# Patient Record
Sex: Male | Born: 1975 | Race: Black or African American | Hispanic: No
Health system: Southern US, Community
[De-identification: ages and names within clinical notes are randomized; demographics above are authoritative.]

## PROBLEM LIST (undated history)

## (undated) HISTORY — PX: BACK SURGERY: SHX140

---

## 1999-11-17 ENCOUNTER — Emergency Department (HOSPITAL_COMMUNITY): Admission: EM | Admit: 1999-11-17 | Discharge: 1999-11-17 | Payer: Self-pay | Admitting: Emergency Medicine

## 2002-02-06 ENCOUNTER — Emergency Department (HOSPITAL_COMMUNITY): Admission: EM | Admit: 2002-02-06 | Discharge: 2002-02-06 | Payer: Self-pay | Admitting: Emergency Medicine

## 2005-07-08 ENCOUNTER — Emergency Department (HOSPITAL_COMMUNITY): Admission: EM | Admit: 2005-07-08 | Discharge: 2005-07-09 | Payer: Self-pay | Admitting: Emergency Medicine

## 2005-09-01 ENCOUNTER — Emergency Department (HOSPITAL_COMMUNITY): Admission: EM | Admit: 2005-09-01 | Discharge: 2005-09-01 | Payer: Self-pay | Admitting: Emergency Medicine

## 2006-06-04 ENCOUNTER — Emergency Department (HOSPITAL_COMMUNITY): Admission: EM | Admit: 2006-06-04 | Discharge: 2006-06-04 | Payer: Self-pay | Admitting: Emergency Medicine

## 2008-11-15 ENCOUNTER — Emergency Department (HOSPITAL_COMMUNITY): Admission: EM | Admit: 2008-11-15 | Discharge: 2008-11-16 | Payer: Self-pay | Admitting: *Deleted

## 2009-06-03 ENCOUNTER — Emergency Department (HOSPITAL_COMMUNITY): Admission: EM | Admit: 2009-06-03 | Discharge: 2009-06-03 | Payer: Self-pay | Admitting: Emergency Medicine

## 2010-12-17 ENCOUNTER — Emergency Department (HOSPITAL_COMMUNITY): Payer: No Typology Code available for payment source

## 2010-12-17 ENCOUNTER — Emergency Department (HOSPITAL_COMMUNITY)
Admission: EM | Admit: 2010-12-17 | Discharge: 2010-12-17 | Disposition: A | Discharge status: Home / Self Care | Payer: No Typology Code available for payment source | Attending: Emergency Medicine | Admitting: Emergency Medicine

## 2010-12-17 DIAGNOSIS — M549 Dorsalgia, unspecified: Secondary | ICD-10-CM | POA: Insufficient documentation

## 2010-12-17 DIAGNOSIS — M4120 Other idiopathic scoliosis, site unspecified: Secondary | ICD-10-CM | POA: Insufficient documentation

## 2011-01-08 ENCOUNTER — Ambulatory Visit: Payer: No Typology Code available for payment source | Admitting: Radiation Oncology

## 2011-07-26 ENCOUNTER — Emergency Department (HOSPITAL_COMMUNITY)
Admission: EM | Admit: 2011-07-26 | Discharge: 2011-07-26 | Disposition: A | Discharge status: Home / Self Care | Payer: PRIVATE HEALTH INSURANCE | Attending: Emergency Medicine | Admitting: Emergency Medicine

## 2011-07-26 ENCOUNTER — Emergency Department (HOSPITAL_COMMUNITY): Payer: PRIVATE HEALTH INSURANCE

## 2011-07-26 DIAGNOSIS — W268XXA Contact with other sharp object(s), not elsewhere classified, initial encounter: Secondary | ICD-10-CM | POA: Insufficient documentation

## 2011-07-26 DIAGNOSIS — S61409A Unspecified open wound of unspecified hand, initial encounter: Secondary | ICD-10-CM | POA: Insufficient documentation

## 2011-07-26 DIAGNOSIS — S61412A Laceration without foreign body of left hand, initial encounter: Secondary | ICD-10-CM

## 2011-07-26 DIAGNOSIS — S61011A Laceration without foreign body of right thumb without damage to nail, initial encounter: Secondary | ICD-10-CM

## 2011-07-26 DIAGNOSIS — S61209A Unspecified open wound of unspecified finger without damage to nail, initial encounter: Secondary | ICD-10-CM | POA: Insufficient documentation

## 2011-07-26 MED ORDER — TETANUS-DIPHTH-ACELL PERTUSSIS 5-2.5-18.5 LF-MCG/0.5 IM SUSP
0.5000 mL | Freq: Once | INTRAMUSCULAR | Status: AC
  Administered 2011-07-26: 0.5 mL via INTRAMUSCULAR
  Filled 2011-07-26: qty 0.5

## 2011-07-26 MED ORDER — HYDROCODONE-ACETAMINOPHEN 5-325 MG PO TABS: 1.0000 | ORAL_TABLET | ORAL | Status: AC | PRN

## 2011-07-26 MED ORDER — CEPHALEXIN 500 MG PO CAPS: 500.0000 mg | ORAL_CAPSULE | Freq: Four times a day (QID) | ORAL | Status: DC

## 2011-07-26 MED ORDER — LIDOCAINE HCL 1 % IJ SOLN
INTRAMUSCULAR | Status: AC
  Administered 2011-07-26: 20:00:00
  Filled 2011-07-26: qty 20

## 2011-07-26 MED ORDER — CEPHALEXIN 500 MG PO CAPS: 500.0000 mg | ORAL_CAPSULE | Freq: Four times a day (QID) | ORAL | Status: AC

## 2011-07-26 MED ORDER — LIDOCAINE-EPINEPHRINE (PF) 1 %-1:200000 IJ SOLN
INTRAMUSCULAR | Status: AC
  Administered 2011-07-26: 20:00:00
  Filled 2011-07-26: qty 10
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
## 2011-07-26 NOTE — ED Notes (Signed)
Patient transported to X-ray 

## 2011-07-26 NOTE — ED Notes (Signed)
MD at bedside. Sutures in progress.
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
## 2011-07-27 NOTE — ED Provider Notes (Signed)
Medical screening examination/treatment/procedure(s) were conducted as a shared visit with non-physician practitioner(s) and myself.  I personally evaluated the patient during the encounter  Personally evaluated the patient's left hand injury.  There is a complex laceration of his left hand without tendon involvement.  There does not appear to be any arterial bleeding.  Normal perfusion of his distal fingers.  Has normal sensation as well and motor function.  The laceration was complex and I placed four deep sutures with 4-0 Vicryl to close the majority wound.  My physician assistant close the remaining portions of the wound.  Examined discharge and was hemostatic with ongoing normal perfusion distally.  Infection warnings were given.  Patient will return to the ER or follow up with his primary care physician for suture removal  Dg Hand Complete Left  07/26/2011  *RADIOLOGY REPORT*  Clinical Data: Hand laceration.  LEFT HAND - COMPLETE 3+ VIEW  Comparison: None  Findings: Soft tissue laceration within the hypothenar eminence. No radiopaque foreign bodies.  No underlying bony abnormality.  No fracture, subluxation or dislocation.  IMPRESSION: No acute bony abnormality.  Original Report Authenticated By: Cyndie Chime, M.D.   Dg Finger Thumb Right  07/26/2011  *RADIOLOGY REPORT*  Clinical Data: Laceration.  RIGHT THUMB 2+V  Comparison: None.  Findings: No acute bony abnormality.  Specifically, no fracture, subluxation, or dislocation.  Soft tissues are intact.  No radiopaque foreign body.  IMPRESSION: No acute bony abnormality.  Original Report Authenticated By: Cyndie Chime, M.D.   I personally evaluated the patient's x-ray  1. Laceration of left hand   2. Laceration of right thumb      Lyanne Co, MD 07/27/11 [phone]

## 2011-09-07 ENCOUNTER — Encounter (HOSPITAL_COMMUNITY): Payer: Self-pay

## 2011-09-07 ENCOUNTER — Emergency Department (HOSPITAL_COMMUNITY)
Admission: EM | Admit: 2011-09-07 | Discharge: 2011-09-07 | Disposition: A | Discharge status: Home / Self Care | Payer: PRIVATE HEALTH INSURANCE | Attending: Emergency Medicine | Admitting: Emergency Medicine

## 2011-09-07 DIAGNOSIS — N509 Disorder of male genital organs, unspecified: Secondary | ICD-10-CM | POA: Insufficient documentation

## 2011-09-07 DIAGNOSIS — N498 Inflammatory disorders of other specified male genital organs: Secondary | ICD-10-CM | POA: Insufficient documentation

## 2011-09-07 DIAGNOSIS — N492 Inflammatory disorders of scrotum: Secondary | ICD-10-CM

## 2011-09-07 DIAGNOSIS — N5089 Other specified disorders of the male genital organs: Secondary | ICD-10-CM | POA: Insufficient documentation

## 2011-09-07 MED ORDER — OXYCODONE-ACETAMINOPHEN 5-325 MG PO TABS: 1.0000 | ORAL_TABLET | Freq: Four times a day (QID) | ORAL | Status: AC | PRN

## 2011-09-07 MED ORDER — SULFAMETHOXAZOLE-TRIMETHOPRIM 800-160 MG PO TABS: 1.0000 | ORAL_TABLET | Freq: Two times a day (BID) | ORAL | Status: AC

## 2011-09-07 NOTE — ED Provider Notes (Signed)
Medical screening examination/treatment/procedure(s) were conducted as a shared visit with non-physician practitioner(s) and myself.  I personally evaluated the patient during the encounter.  He has a right scrotum mass that worsened after he squeezed it. It's been present previously and he had it opened due to infection. Has not had a fever, nausea, vomiting, or difficulty urinating. Right scrotum has a 3 x 3 x 2 cm deep red and fluctuant area. Penis is normal. Testicles are palpable in the scrotum and are normal. We'll contact urology for assistance in treatment and disposition.  Flint Melter, MD 09/07/11 [phone]

## 2011-09-07 NOTE — ED Notes (Signed)
The pt says he has a rt groin abcess for 2 days.  He has had previous ones.

## 2011-09-07 NOTE — ED Provider Notes (Signed)
History     CSN: [id number]  Arrival date & time 09/07/11  0100   First MD Initiated Contact with Patient 09/07/11 [phone]      Chief Complaint  Patient presents with  . Abscess    right inner thigh/groin area    (Consider location/radiation/quality/duration/timing/severity/associated sxs/prior treatment) HPI Patient presents emergency Dept. with an abscess to his right scrotum.  He states that the area developed over the last 2 days.  He states the areas gotten more swollen since yesterday.  He denies any testicular pain or swelling around  the rest of the scrotum.  Patient denies fevers, nausea/vomiting, abdominal pain, perineal pain, or dysuria. History reviewed. No pertinent past medical history.  Past Surgical History  Procedure Date  . Back surgery     No family history on file.  History  Substance Use Topics  . Smoking status: Never Smoker   . Smokeless tobacco: Not on file  . Alcohol Use: No      Review of Systems All pertinent positives and negatives reviewed in the history of present illness  Allergies  Review of patient's allergies indicates no known allergies.  Home Medications   Current Outpatient Rx  Name Route Sig Dispense Refill  . HYDROCODONE-ACETAMINOPHEN 5-325 MG PO TABS Oral Take 1 tablet by mouth every 6 (six) hours as needed. For pain    . SULFAMETHOXAZOLE-TRIMETHOPRIM 800-160 MG PO TABS Oral Take 1 tablet by mouth 2 (two) times daily. Take until all medication used. 42 tablet 0    BP 133/86  Pulse 88  Temp(Src) 98 F (36.7 C) (Oral)  Resp 18  SpO2 100%  Physical Exam  Constitutional: He appears well-developed and well-nourished. No distress.  HENT:  Head: Normocephalic and atraumatic.  Cardiovascular: Normal rate and regular rhythm.   Pulmonary/Chest: Effort normal and breath sounds normal.  Skin: Skin is warm and dry.       Patient has an abscess to the right scrotum.  The area measures 3 x 4 cm with about 2 cm at depth to it.     ED Course  Procedures (including critical care time)  Labs Reviewed  GLUCOSE, CAPILLARY - Abnormal; Notable for the following:    Glucose-Capillary 107 (*)    All other components within normal limits   I spoke with Dr. Berneice Heinrich.  He is to come in and see the patient for I&D of this abscessed area of his right scrotum.  He will set up followup with patient in his office.       MDM  See above        Carlyle Dolly, PA-C 09/07/11 0300  Carlyle Dolly, PA-C 09/07/11 [phone]

## 2011-09-07 NOTE — Op Note (Signed)
NAMEMALIK, PAAR             ACCOUNT NO.:  [id number]  MEDICAL RECORD NO.:  [id number]  LOCATION:  CDU11                        FACILITY:  [facility]  PHYSICIAN:  Sebastian Ache, MD     DATE OF BIRTH:  [birth year]  DATE OF PROCEDURE: DATE OF DISCHARGE:  09/07/2011                              OPERATIVE REPORT   DIAGNOSIS:  Right scrotal abscess.  PROCEDURE:  Incision and drainage of right scrotal abscess at bedside.  FINDINGS: 1. A 4-cm right scrotal abscess separate from cord, testicles. 2. No evidence of fasciitis or systemic infection.  PREASSESSMENT:  None.  DRAINS:  Wound is packed, no drains.  COMPLICATIONS:  None.  ESTIMATED BLOOD LOSS:  Nil.  INDICATIONS:  Daniel Frederick is a 36 year old very pleasant gentleman with history of prior minor soft tissue infections who presents with 2 days of acutely worsened right scrotal swelling and pain.  Exam is consistent with scrotal wall abscess so that the burden of material is not amenable to oral antibiotics alone and that incision and drainage be warranted. Also discussed proceeding at the bedside versus the OR and the patient is amenable to a bedside drainage.  A verbal consent was obtained.  PROCEDURE IN DETAIL:  The patient being Daniel Frederick, verified; procedure being incision and drainage confirmed.  The procedure was carried out.  A sterile field was created using disposable sterile towels and iodine solution in the right hemiscrotum.  Infiltration with 10 mL 2% lidocaine was performed.  A 3-cm incision was then made directly into the abscess which was then evacuated of all purulent material.  Digital palpation of the wound revealed no tracking towards the groin or abdominal wall, incomplete evacuation of all palpable material.  This was then packed using iodoform gauze.  Dry dressing applied.  The patient tolerated the procedure well and will be discharged from the ER with oral antibiotics.     ______________________________ Sebastian Ache, MD     TM/MEDQ  D:  09/07/2011  T:  09/07/2011  Job:  [id number]

## 2011-09-07 NOTE — ED Notes (Signed)
Patient is AOx4 and comfortable with his discharge instructions. 

## 2011-09-07 NOTE — Brief Op Note (Signed)
  2:53 AM  PATIENT:  Daniel Frederick  36 y.o. male  PRE-OPERATIVE DIAGNOSIS: Scrotal Wall Abscess  PROCEDURE:  I+D Scrotum (bedside)  SURGEON:  T. Tyreona Panjwani  ANESTHESIA:   10cc lidocaine local infiltration  EBL: nil      DRAINS: wound packing only  DICTATION: .Other Dictation: Dictation Number [id number]  PLAN OF CARE: Discharge from ER

## 2011-09-07 NOTE — ED Provider Notes (Signed)
Medical screening examination/treatment/procedure(s) were conducted as a shared visit with non-physician practitioner(s) and myself.  I personally evaluated the patient during the encounter  Flint Melter, MD 09/07/11 [phone]

## 2011-09-07 NOTE — Consult Note (Signed)
Reason for Consult:Scrotal Wall Abscess Referring Physician: ER Physician  Daniel Frederick is an 36 y.o. male.  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 Notes 2 mo prodrome of small right scrotal boil that has come and gone but now with increased pain and swelling. No drainage. No fevers, No malaise. No change in voiding or bowel habits.   Pain is 4/10, constant, located in right scrotum, better with lying still.  PMH sig for scoliosis s/p surgery in late teens, no other surgeries. No prior GU eval. NO h/o HIV, immunosupression or diabetes.  History reviewed. No pertinent past medical history.  Past Surgical History  Procedure Date  . Back surgery     No family history on file.  Social History:  reports that he has never smoked. He does not have any smokeless tobacco history on file. He reports that he uses illicit drugs (Marijuana). He reports that he does not drink alcohol.  Allergies: No Known Allergies  Medications: I have reviewed the patient's current medications.  No results found for this or any previous visit (from the past 48 hour(s)).  No results found.  Review of Systems  Constitutional: Negative for fever, chills and malaise/fatigue.  HENT: Negative.   Eyes: Negative.   Respiratory: Negative.   Cardiovascular: Negative.   Gastrointestinal: Positive for nausea. Negative for vomiting.  Genitourinary: Negative.  Negative for dysuria, frequency, hematuria and flank pain.       Progressive scrotal pain / swelling  Musculoskeletal: Negative.   Skin: Negative.   Endo/Heme/Allergies: Negative.   Psychiatric/Behavioral: Negative.    Blood pressure 133/86, pulse 88, temperature 98 F (36.7 C), temperature source Oral, resp. rate 18, SpO2 100.00%. Physical Exam    Constitutional: He is oriented to person, place, and time. He appears well-developed and well-nourished.       In [facility] CDU bed 11 in ER  HENT:  Head: Normocephalic and atraumatic.  Eyes: Pupils are equal, round, and reactive to light.  Neck: Normal range of motion. Neck supple.  Cardiovascular: Normal rate and regular rhythm.   Respiratory: Effort normal and breath sounds normal.  GI: Soft. Bowel sounds are normal. He exhibits no mass. There is no tenderness. There is no rebound and no guarding.  Genitourinary: Penis normal.       4cm rt scrotal wall abscess. No tracking to groin. No crepitus. Separate from cord / testicle. No tracking to abdomen.  Musculoskeletal: Normal range of motion.  Neurological: He is alert and oriented to person, place, and time.  Skin: Skin is warm and dry.  Psychiatric: He has a normal mood and affect. His behavior is normal. Judgment and thought content normal.    Assessment/Plan: 1 - Scrotal Abscess - I+D performed at bedside as per separate dictated note. NO s/s fascitits or systemic infection. Pt to pack wound and come to [facility] clinic for re-pack in few days. RX'd 3 weeks bactrim for skin and empiric MRSA coverage. Warned to return to ED for worsening infection, high fevers, or swelling / tracking to groin or abdomen.  Daniel Frederick 09/07/2011, 2:43 AM

## 2012-06-03 IMAGING — CR DG FINGER THUMB 2+V*R*
3 series · 3 of 3 positions shown · non-contrast
Comparison: None.

CLINICAL DATA: Laceration.

RIGHT THUMB 2+V

[x finger pa right (1 of 2)]
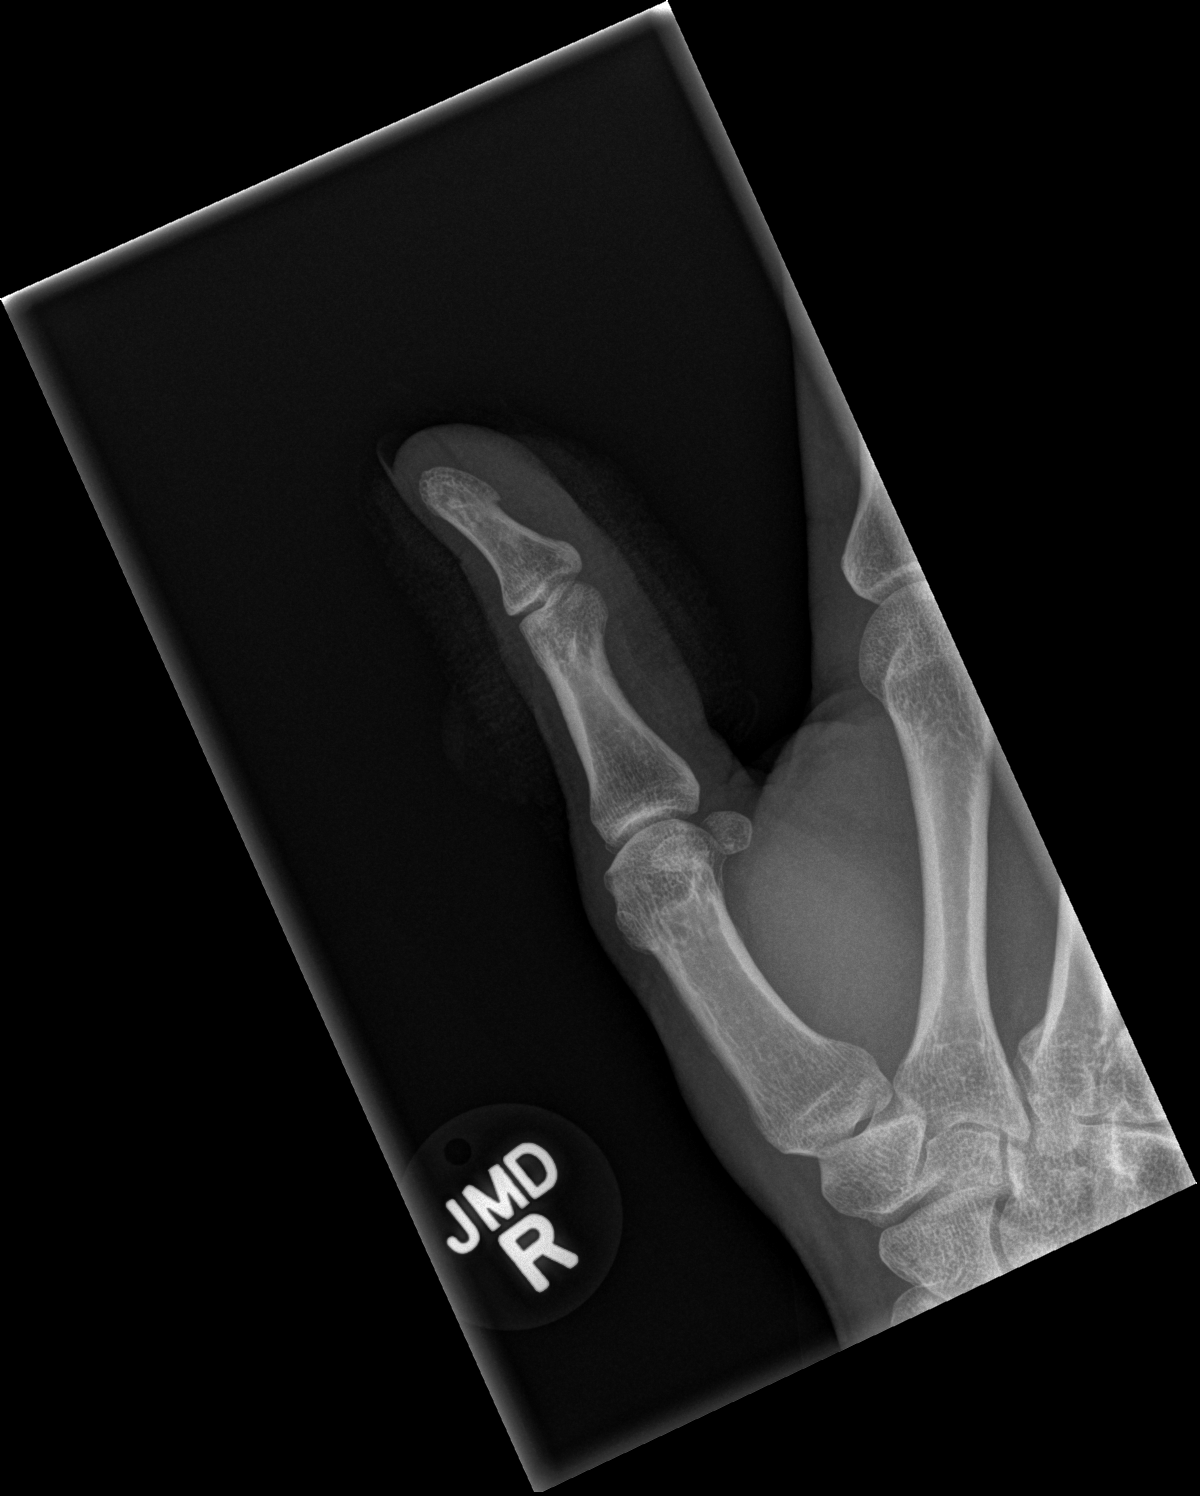

[x finger pa right (2 of 2)]
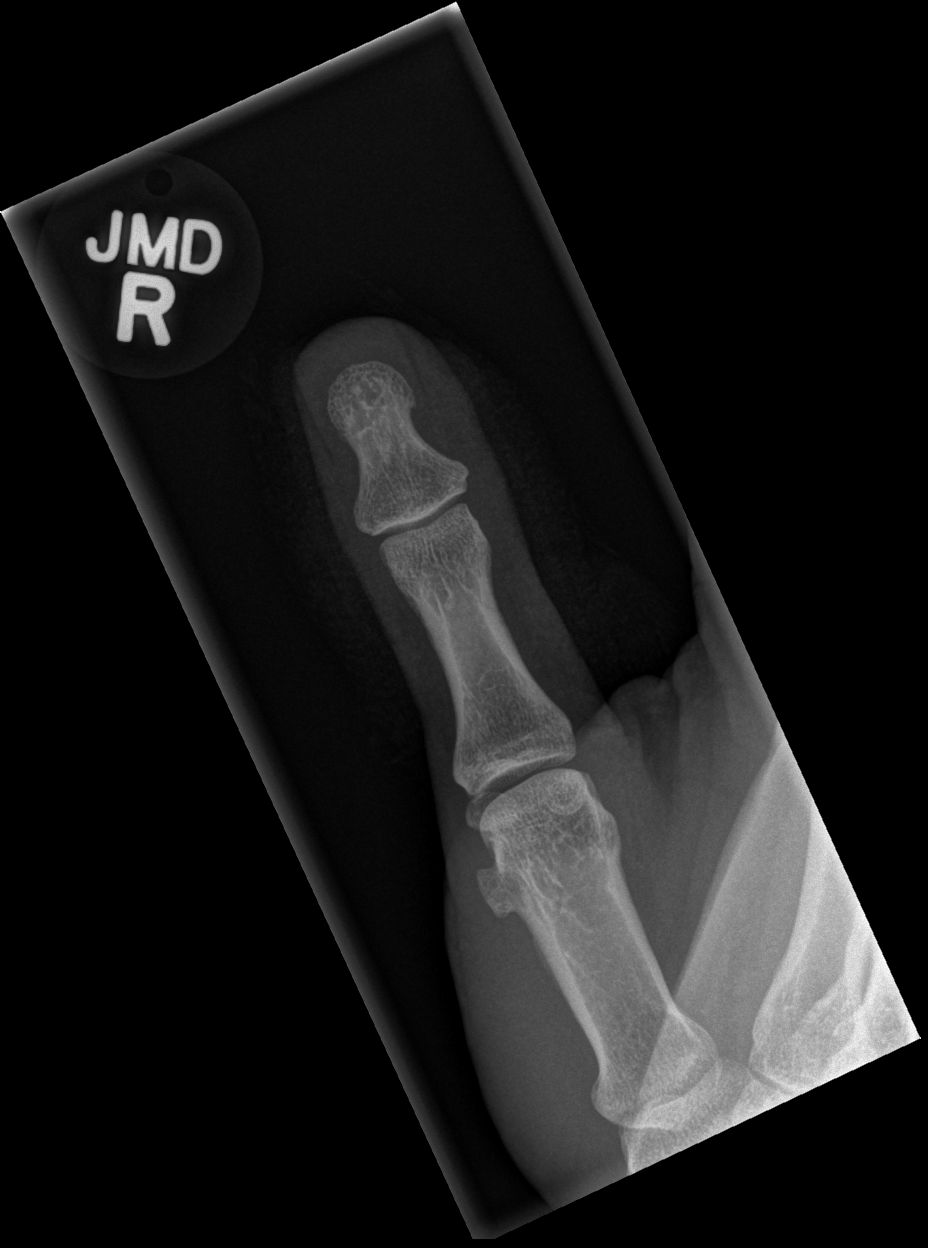

[x finger lat right]
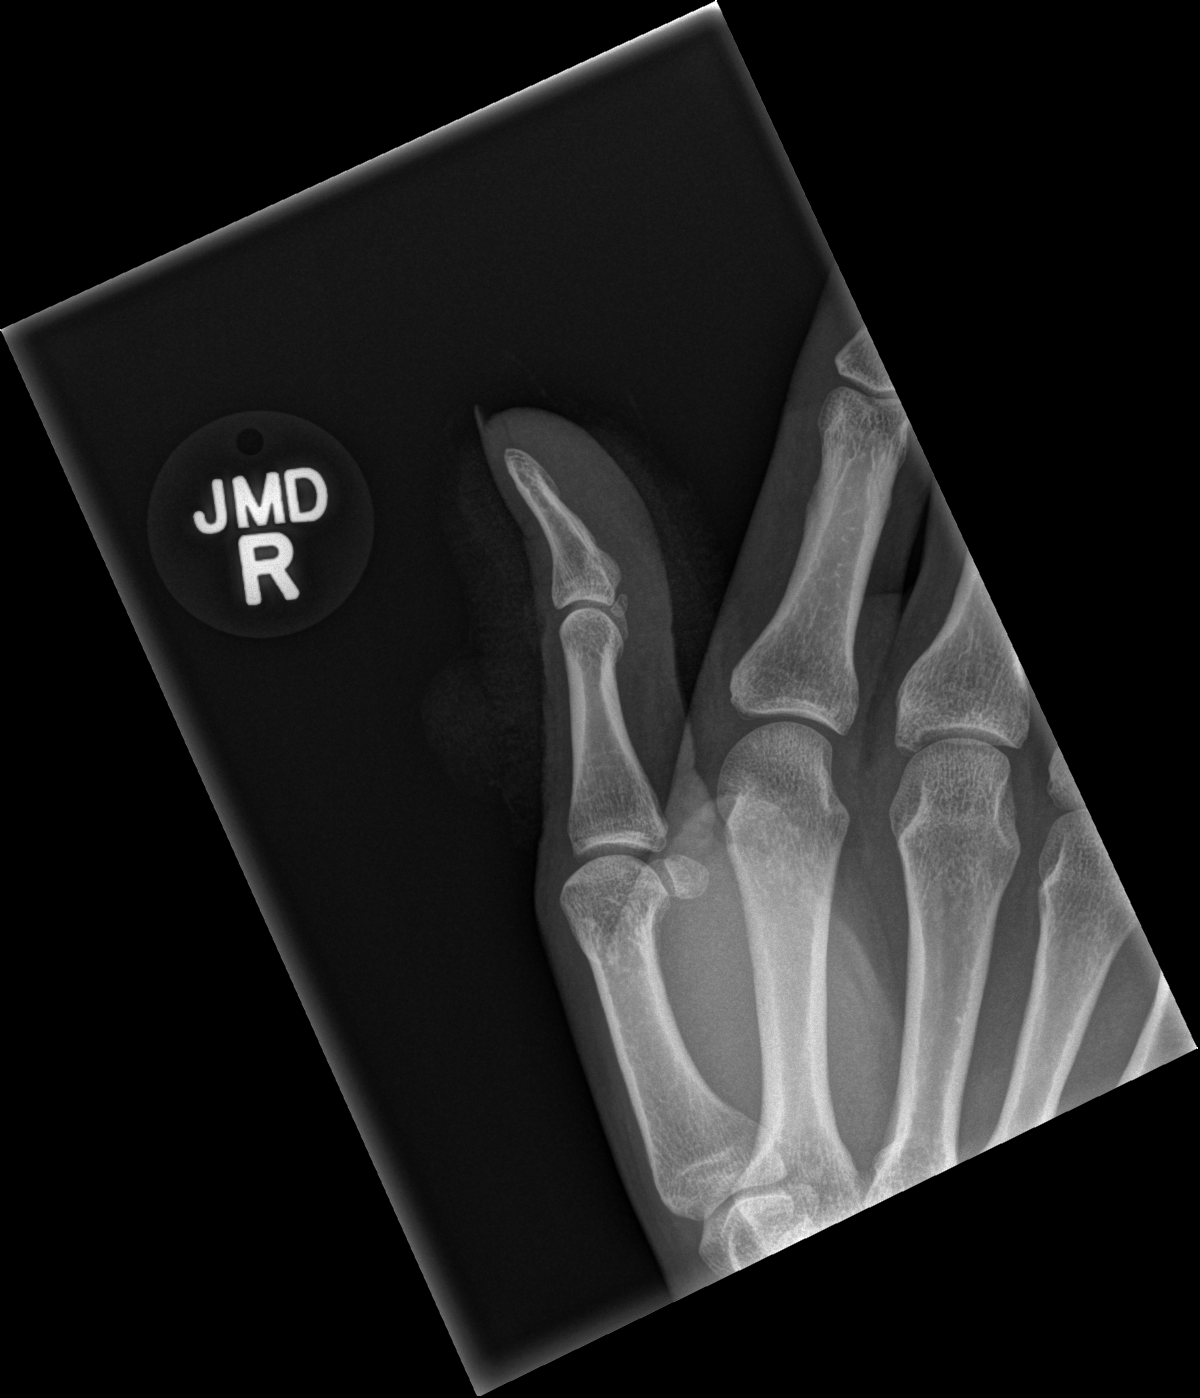

[3 of 3 positions shown; findings below may reference images not displayed]

FINDINGS: No acute bony abnormality.  Specifically, no fracture,
subluxation, or dislocation.  Soft tissues are intact.  No
radiopaque foreign body.
IMPRESSION: No acute bony abnormality.

## 2012-06-03 IMAGING — CR DG HAND COMPLETE 3+V*L*
3 series · 3 of 3 positions shown · non-contrast
Comparison: None

CLINICAL DATA: Hand laceration.

LEFT HAND - COMPLETE 3+ VIEW

[x hand pa left (1 of 2)]
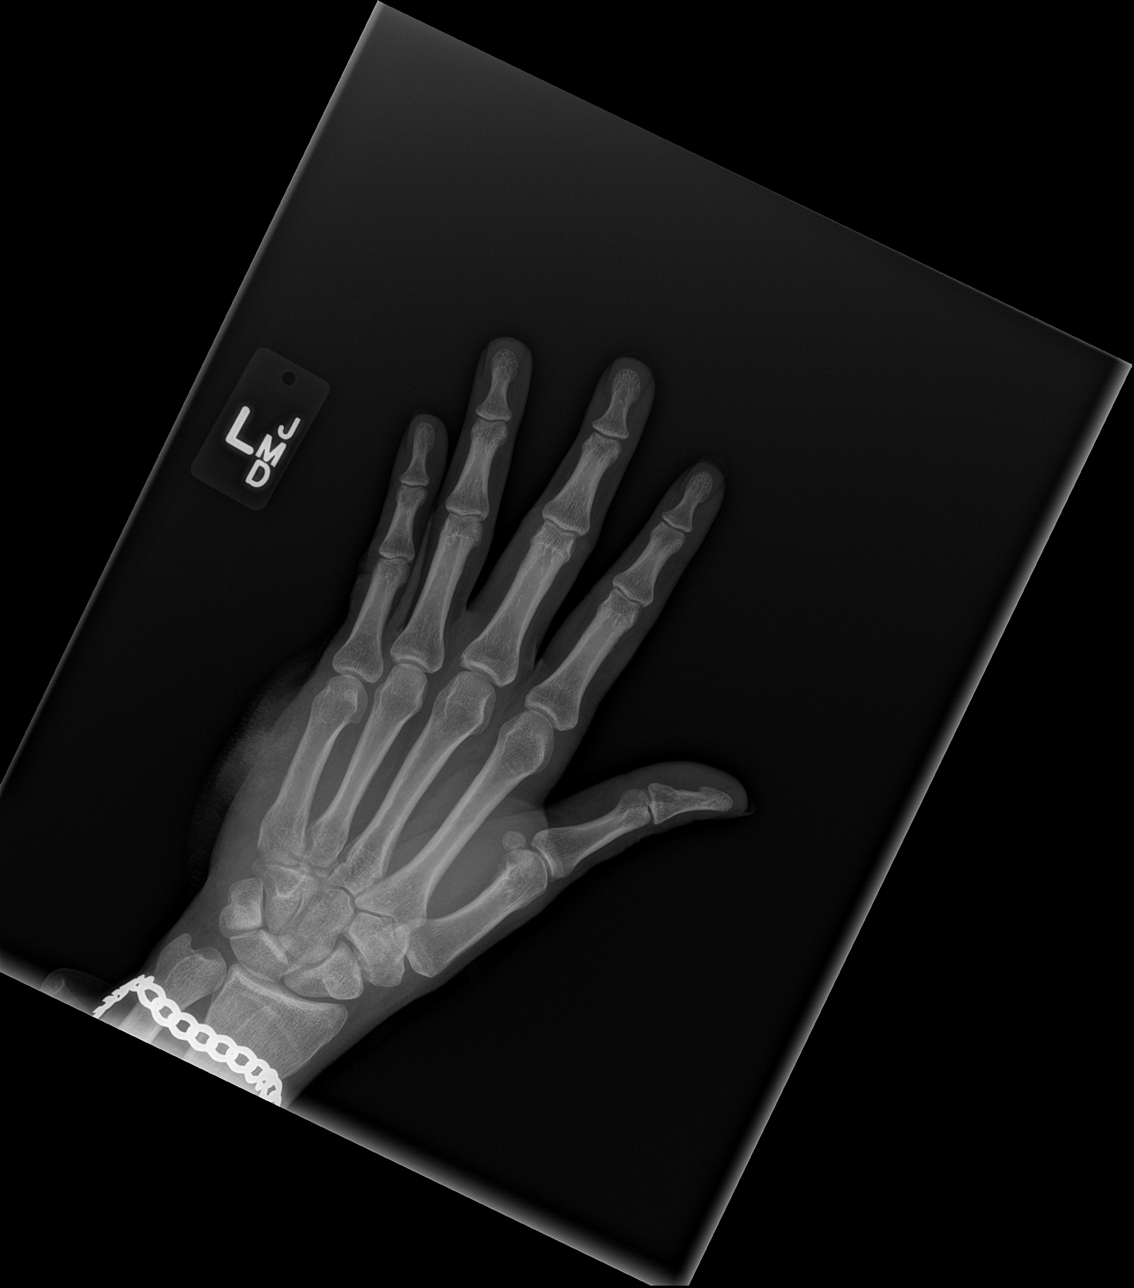

[x hand pa left (2 of 2)]
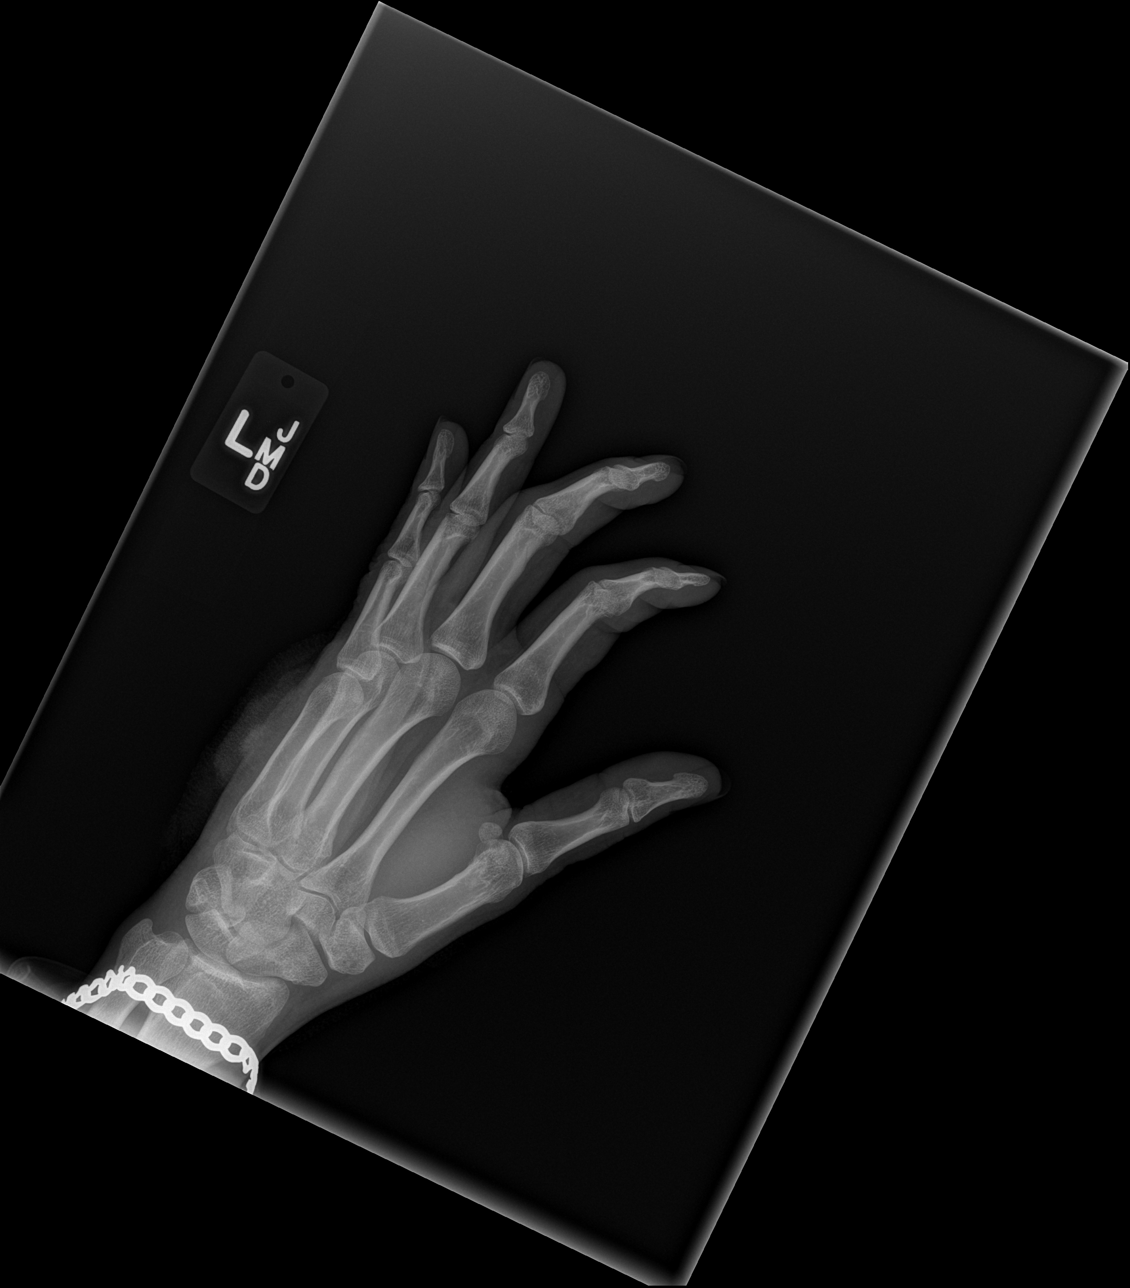

[x hand lat left]
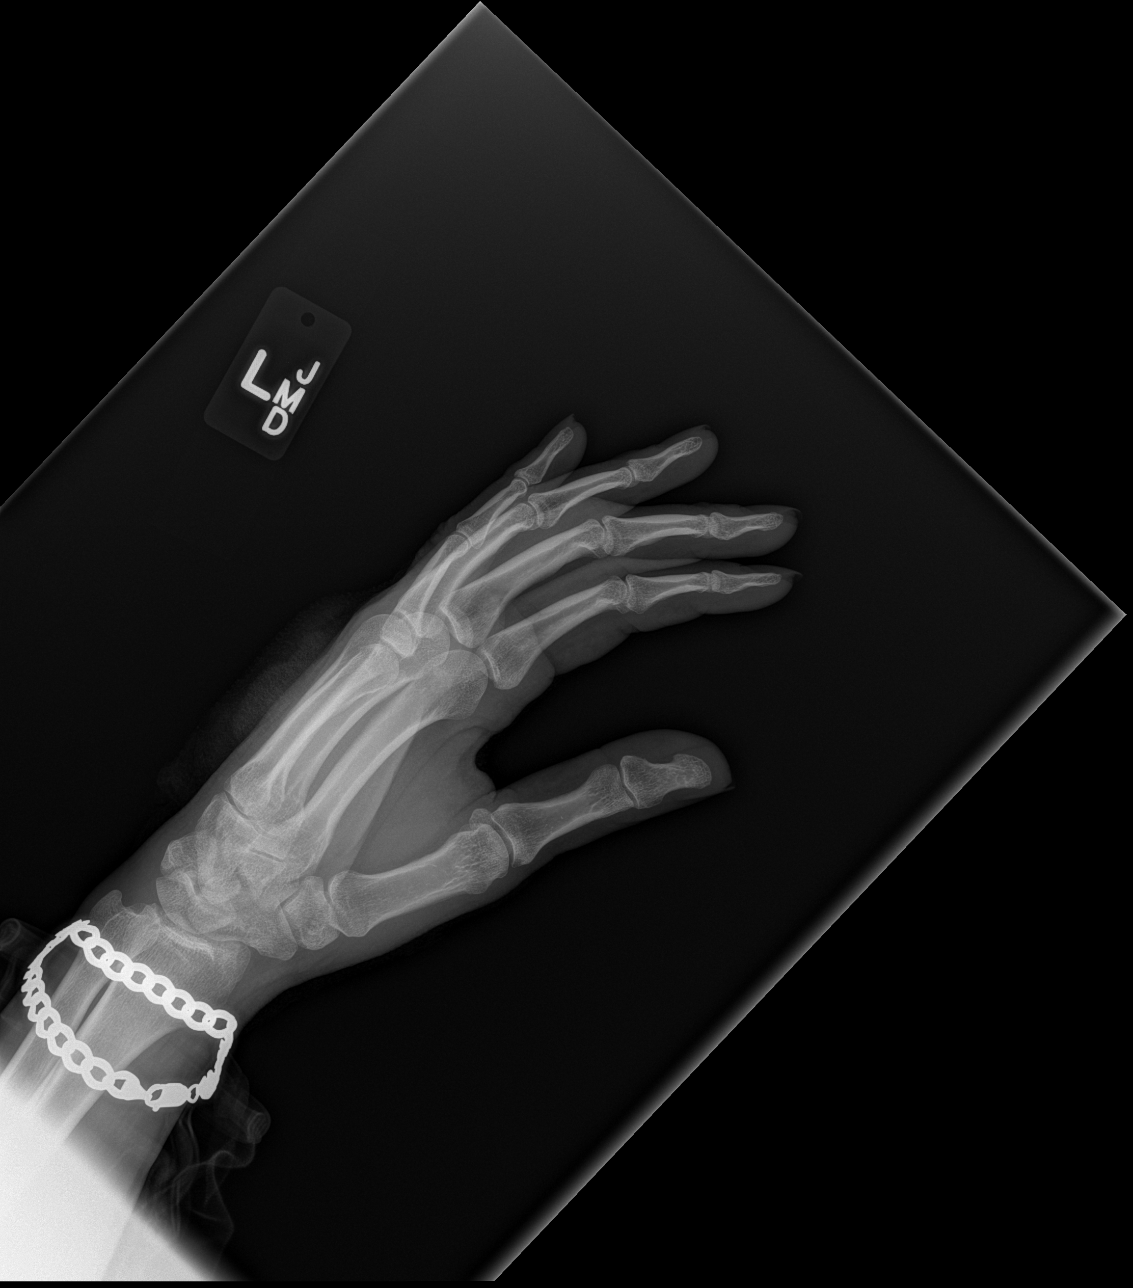

[3 of 3 positions shown; findings below may reference images not displayed]

FINDINGS: Soft tissue laceration within the hypothenar eminence.
No radiopaque foreign bodies.  No underlying bony abnormality.  No
fracture, subluxation or dislocation.
IMPRESSION: No acute bony abnormality.

## 2013-02-01 ENCOUNTER — Emergency Department (HOSPITAL_COMMUNITY)
Admission: EM | Admit: 2013-02-01 | Discharge: 2013-02-01 | Disposition: A | Discharge status: Home / Self Care | Payer: Self-pay | Attending: Emergency Medicine | Admitting: Emergency Medicine

## 2013-02-01 ENCOUNTER — Encounter (HOSPITAL_COMMUNITY): Payer: Self-pay | Admitting: Emergency Medicine

## 2013-02-01 DIAGNOSIS — L0291 Cutaneous abscess, unspecified: Secondary | ICD-10-CM

## 2013-02-01 DIAGNOSIS — IMO0002 Reserved for concepts with insufficient information to code with codable children: Secondary | ICD-10-CM | POA: Insufficient documentation

## 2013-02-01 NOTE — ED Notes (Signed)
Dressing applied to right underarm,  4x4 gauze and gauze roll

## 2013-02-01 NOTE — ED Notes (Signed)
Pt states he has an abscess under his right arm  Pt states it has been there for a few days and has just gotten worse  Pt states it is very painful

## 2013-02-01 NOTE — ED Provider Notes (Signed)
History    CSN: [id number] Arrival date & time 02/01/13  4098  First MD Initiated Contact with Patient 02/01/13 0501     Chief Complaint  Patient presents with  . Abscess   (Consider location/radiation/quality/duration/timing/severity/associated sxs/prior Treatment) HPI Comments: Patient with a history of chronic, recurrent abscesses, is a new abscess in his right axilla.  He, states he normally gets his abscesses in his groin.  Denies any injury, skin, myalgias, fever.  The abscess is not draining  Patient is a 37 y.o. male presenting with abscess. The history is provided by the patient.  Abscess Location:  Shoulder/arm Shoulder/arm abscess location:  R axilla Abscess quality: fluctuance, painful and redness   Duration:  3 days Progression:  Worsening Pain details:    Quality:  Pressure   Severity:  Mild   Duration:  3 days   Timing:  Constant Chronicity:  New Context: insect bite/sting   Context: not diabetes, not immunosuppression, not injected drug use and not skin injury   Associated symptoms: no fever    History reviewed. No pertinent past medical history. Past Surgical History  Procedure Laterality Date  . Back surgery     Family History  Problem Relation Age of Onset  . Diabetes Other    History  Substance Use Topics  . Smoking status: Never Smoker   . Smokeless tobacco: Not on file  . Alcohol Use: No    Review of Systems  Constitutional: Negative for fever and chills.  Respiratory: Negative.   Endocrine: Negative.   Genitourinary: Negative.   Musculoskeletal: Negative.   Skin: Positive for wound.  Allergic/Immunologic: Negative.   Neurological: Negative.   All other systems reviewed and are negative.    Allergies  Review of patient's allergies indicates no known allergies.  Home Medications  No current outpatient prescriptions on file. BP 126/94  Pulse 77  Temp(Src) 98.1 F (36.7 C) (Oral)  Resp 16  SpO2 98% Physical Exam  Nursing note  and vitals reviewed. Constitutional: He is oriented to person, place, and time. He appears well-developed and well-nourished.  HENT:  Head: Normocephalic.  Eyes: Pupils are equal, round, and reactive to light.  Neck: Normal range of motion.  Cardiovascular: Normal rate and regular rhythm.   Pulmonary/Chest: Effort normal.  Musculoskeletal: Normal range of motion. He exhibits tenderness.  Neurological: He is alert and oriented to person, place, and time.  Skin: Skin is warm. There is erythema.  3 cm x 2 cm, oval, abscess, fluctuant in the right axilla without surrounding cellulitis or induration    ED Course  INCISION AND DRAINAGE Date/Time: 02/01/2013 5:30 AM Performed by: Arman Filter Authorized by: Arman Filter Consent: Verbal consent obtained. Risks and benefits: risks, benefits and alternatives were discussed Consent given by: patient Patient identity confirmed: verbally with patient Time out: Immediately prior to procedure a "time out" was called to verify the correct patient, procedure, equipment, support staff and site/side marked as required. Type: abscess Body area: upper extremity Location details: right arm Anesthesia: local infiltration Local anesthetic: lidocaine 1% without epinephrine Anesthetic total: 1 ml Patient sedated: no Scalpel size: 11 Needle gauge: 22 Incision type: single straight Complexity: simple Drainage: purulent Drainage amount: copious Packing material: 1/4 in iodoform gauze Patient tolerance: Patient tolerated the procedure well with no immediate complications.   (including critical care time) Labs Reviewed - No data to display No results found. 1. Abscess     MDM   Abscess was opened and drained.  Small amount  of iodoform dressing, placed to act as a wick.  Patient has been instructed to pull this in 2 days  Arman Filter, NP 02/01/13 [phone]

## 2013-02-03 NOTE — ED Provider Notes (Signed)
Medical screening examination/treatment/procedure(s) were performed by non-physician practitioner and as supervising physician I was immediately available for consultation/collaboration.  Sunnie Nielsen, MD 02/03/13 [phone]

## 2016-02-29 ENCOUNTER — Encounter (HOSPITAL_COMMUNITY): Payer: Self-pay | Admitting: Anesthesiology

## 2016-02-29 ENCOUNTER — Encounter (HOSPITAL_COMMUNITY): Admission: EM | Disposition: E | Payer: Self-pay | Source: Home / Self Care | Attending: Emergency Medicine

## 2016-02-29 ENCOUNTER — Emergency Department (HOSPITAL_COMMUNITY): Payer: Self-pay

## 2016-02-29 ENCOUNTER — Emergency Department (HOSPITAL_COMMUNITY)
Admission: EM | Admit: 2016-02-29 | Discharge: 2016-03-04 | Disposition: E | Payer: Self-pay | Attending: Emergency Medicine | Admitting: Emergency Medicine

## 2016-02-29 DIAGNOSIS — S21109A Unspecified open wound of unspecified front wall of thorax without penetration into thoracic cavity, initial encounter: Secondary | ICD-10-CM | POA: Insufficient documentation

## 2016-02-29 DIAGNOSIS — Y929 Unspecified place or not applicable: Secondary | ICD-10-CM | POA: Insufficient documentation

## 2016-02-29 DIAGNOSIS — Y939 Activity, unspecified: Secondary | ICD-10-CM | POA: Insufficient documentation

## 2016-02-29 DIAGNOSIS — Y999 Unspecified external cause status: Secondary | ICD-10-CM | POA: Insufficient documentation

## 2016-02-29 DIAGNOSIS — R791 Abnormal coagulation profile: Secondary | ICD-10-CM | POA: Insufficient documentation

## 2016-02-29 LAB — DIC (DISSEMINATED INTRAVASCULAR COAGULATION) PANEL
INR: 7.9 — AB
PLATELETS: 34 10*3/uL — AB (ref 150–400)

## 2016-02-29 LAB — PREPARE PLATELET PHERESIS: Unit division: 0

## 2016-02-29 LAB — HEMOGLOBIN AND HEMATOCRIT, BLOOD
HCT: 9.3 % — ABNORMAL LOW (ref 39.0–52.0)
Hemoglobin: 2.8 g/dL — CL (ref 13.0–17.0)

## 2016-02-29 LAB — DIC (DISSEMINATED INTRAVASCULAR COAGULATION)PANEL
D-Dimer, Quant: >20 ug/mL-FEU — ABNORMAL HIGH (ref 0.00–0.50)
Prothrombin Time: 69 seconds — ABNORMAL HIGH (ref 11.4–15.2)
Smear Review: NONE SEEN

## 2016-02-29 LAB — MASSIVE TRANSFUSION PROTOCOL ORDER (BLOOD BANK NOTIFICATION)

## 2016-02-29 SURGERY — LAPAROTOMY, EXPLORATORY
Anesthesia: General

## 2016-02-29 MED ORDER — SODIUM BICARBONATE 8.4 % IV SOLN
INTRAVENOUS | Status: AC | PRN
  Administered 2016-02-29 (×2): 50 meq via INTRAVENOUS

## 2016-02-29 MED ORDER — EPINEPHRINE HCL 0.1 MG/ML IJ SOSY
PREFILLED_SYRINGE | INTRAMUSCULAR | Status: AC | PRN
  Administered 2016-02-29 (×13): 1 via INTRAVENOUS

## 2016-02-29 MED ORDER — SUFENTANIL CITRATE 50 MCG/ML IV SOLN
INTRAVENOUS | Status: AC
  Filled 2016-02-29: qty 1

## 2016-02-29 MED ORDER — LIDOCAINE 2% (20 MG/ML) 5 ML SYRINGE
INTRAMUSCULAR | Status: AC
  Filled 2016-02-29: qty 5

## 2016-02-29 MED ORDER — ONDANSETRON HCL 4 MG/2ML IJ SOLN
INTRAMUSCULAR | Status: AC
  Filled 2016-02-29: qty 2

## 2016-02-29 MED ORDER — TRANEXAMIC ACID 1000 MG/10ML IV SOLN
1000.0000 mg | Freq: Once | INTRAVENOUS | Status: DC
  Filled 2016-02-29: qty 10

## 2016-02-29 MED ORDER — TRANEXAMIC ACID 1000 MG/10ML IV SOLN
1000.0000 mg | Freq: Once | INTRAVENOUS | Status: AC
  Administered 2016-02-29: 1000 mg via INTRAVENOUS
  Filled 2016-02-29: qty 10

## 2016-02-29 MED ORDER — PROPOFOL 10 MG/ML IV BOLUS
INTRAVENOUS | Status: AC
  Filled 2016-02-29: qty 20

## 2016-02-29 MED ORDER — SODIUM CHLORIDE 0.9 % IV SOLN
INTRAVENOUS | Status: AC | PRN
  Administered 2016-02-29 (×5): 1000 mL via INTRAVENOUS

## 2016-02-29 MED ORDER — ROCURONIUM BROMIDE 50 MG/5ML IV SOLN
INTRAVENOUS | Status: AC
  Filled 2016-02-29: qty 1

## 2016-02-29 MED ORDER — SUCCINYLCHOLINE CHLORIDE 200 MG/10ML IV SOSY
PREFILLED_SYRINGE | INTRAVENOUS | Status: AC
  Filled 2016-02-29: qty 10

## 2016-02-29 MED ORDER — SODIUM CHLORIDE 0.9 % IJ SOLN
INTRAMUSCULAR | Status: AC
  Filled 2016-02-29: qty 10

## 2016-02-29 MED ORDER — NOREPINEPHRINE BITARTRATE 1 MG/ML IV SOLN
0.0000 ug/min | Freq: Once | INTRAVENOUS | Status: AC
  Administered 2016-02-29: 8 ug/min via INTRAVENOUS

## 2016-02-29 MED ORDER — MIDAZOLAM HCL 2 MG/2ML IJ SOLN
INTRAMUSCULAR | Status: AC
  Filled 2016-02-29: qty 2

## 2016-02-29 NOTE — Op Note (Signed)
Central Venous Catheter Insertion Procedure Note Rondal Grice [id number] 02-20-1976  Procedure: Insertion of Central Venous Catheter Indications: Drug and/or fluid administration and inadequate peripheral access  Procedure Details Consent: Unable to obtain consent because of intubation and emergent situation.  Skin prep: Iodine solution; local anesthetic administered A antimicrobial bonded/coated triple lumen large bore introducer catheter was placed in the left femoral vein due to emergent situation using the Seldinger technique.  Evaluation Blood flow good Complications: No apparent complications  Chiquita Heckert Mar 13, 2016, 8:38 PM
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
## 2016-03-01 ENCOUNTER — Encounter (HOSPITAL_COMMUNITY): Payer: Self-pay | Admitting: Emergency Medicine

## 2016-03-01 LAB — TYPE AND SCREEN
ABO/RH(D): O POS
ANTIBODY SCREEN: NEGATIVE
UNIT DIVISION: 0
UNIT DIVISION: 0
UNIT DIVISION: 0
UNIT DIVISION: 0
UNIT DIVISION: 0
UNIT DIVISION: 0
Unit division: 0
Unit division: 0

## 2016-03-01 LAB — PREPARE FRESH FROZEN PLASMA
UNIT DIVISION: 0
UNIT DIVISION: 0
UNIT DIVISION: 0
Unit division: 0
Unit division: 0
Unit division: 0

## 2016-03-02 MED FILL — Medication: Qty: 1 | Status: AC

## 2016-03-03 ENCOUNTER — Encounter (HOSPITAL_COMMUNITY): Payer: Self-pay | Admitting: Emergency Medicine

## 2016-03-04 NOTE — ED Notes (Signed)
Pulses lost at this time.  CPR resumed.

## 2016-03-04 NOTE — ED Notes (Signed)
Rhythm check.  PEA on monitor, rate in 104.  CPR resumed.

## 2016-03-04 NOTE — ED Notes (Signed)
Pulse check.  No pulses found, PEA on monitor, CPR resumed.

## 2016-03-04 NOTE — ED Notes (Signed)
Officer Rondall Allegra #10 with [employer] here to pick-up belongings at this time. 2 pairs of shorts, 2 socks, $1.31 in change, boxers, black belt, and 1 plastic bag of herbal mixture- belongings were placed in paper bag at 2016-03-17 @ 2050 by Luciano Cutter and Amedeo Gory.  Paper bag taped, signed and labeled

## 2016-03-04 NOTE — ED Notes (Signed)
No pulses or cardiac activity per Dr Donell Beers and Dr Jeraldine Loots.  TOD at 1947.

## 2016-03-04 NOTE — ED Notes (Signed)
1st Unit of FFP started at this time.  Unit # [id number].

## 2016-03-04 NOTE — ED Notes (Signed)
Rhythm and pulse check.  Patient remains in PEA, no pulses.  CPR resumed.  Third unit of FFP #[id number] started.
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
## 2016-03-04 NOTE — ED Notes (Signed)
4th unit #[id number] started at this time.

## 2016-03-04 NOTE — ED Notes (Signed)
2nd unit of FFP finished at this time.

## 2016-03-04 NOTE — ED Notes (Signed)
3rd unit of PRBCs Unit# [id number] started.  Pulse and rhythm check.  No pulse, PEA on monitor, CPR resumed.

## 2016-03-04 NOTE — ED Notes (Signed)
1st and 2nd units of PRBCs finished.

## 2016-03-04 NOTE — ED Notes (Addendum)
PRBCs O neg Unit #2 Unit # [id number] started.  Pulses regained at this time, Sinus tach, rate of 120.
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
## 2016-03-04 NOTE — ED Notes (Signed)
PRBC O neg #1 Unit #[id number] started.

## 2016-03-04 NOTE — ED Notes (Signed)
1st unit of FFP finished, 2nd Unit # [id number] started.

## 2016-03-04 NOTE — ED Notes (Signed)
3rd unit of FFP finished, 4th unit # [id number] started.

## 2016-03-04 DEATH — deceased
